# Patient Record
Sex: Male | Born: 1940 | Race: White | Hispanic: No
Health system: Southern US, Community
[De-identification: ages and names within clinical notes are randomized; demographics above are authoritative.]

---

## 2006-11-18 ENCOUNTER — Emergency Department: Payer: Self-pay | Admitting: Emergency Medicine

## 2006-11-18 ENCOUNTER — Other Ambulatory Visit: Payer: Self-pay

## 2007-12-23 ENCOUNTER — Ambulatory Visit: Payer: Self-pay | Admitting: Family Medicine

## 2009-04-02 ENCOUNTER — Emergency Department: Payer: Self-pay | Admitting: Internal Medicine

## 2009-12-31 ENCOUNTER — Emergency Department: Payer: Self-pay | Admitting: Internal Medicine

## 2013-04-28 ENCOUNTER — Ambulatory Visit: Payer: Self-pay | Admitting: Gastroenterology

## 2013-04-29 LAB — PATHOLOGY REPORT

## 2013-12-05 LAB — URINALYSIS, COMPLETE
Bacteria: NONE SEEN
Bilirubin,UR: NEGATIVE
GLUCOSE, UR: NEGATIVE mg/dL (ref 0–75)
Leukocyte Esterase: NEGATIVE
NITRITE: NEGATIVE
Ph: 6 (ref 4.5–8.0)
Protein: 30
RBC,UR: 362 /HPF (ref 0–5)
Specific Gravity: 1.02 (ref 1.003–1.030)
Squamous Epithelial: 1
WBC UR: 1 /HPF (ref 0–5)

## 2013-12-05 LAB — CBC
HCT: 39 % — ABNORMAL LOW (ref 40.0–52.0)
HGB: 13 g/dL (ref 13.0–18.0)
MCH: 33.1 pg (ref 26.0–34.0)
MCHC: 33.2 g/dL (ref 32.0–36.0)
MCV: 100 fL (ref 80–100)
PLATELETS: 198 10*3/uL (ref 150–440)
RBC: 3.92 10*6/uL — AB (ref 4.40–5.90)
RDW: 14.3 % (ref 11.5–14.5)
WBC: 9.1 10*3/uL (ref 3.8–10.6)

## 2013-12-05 LAB — COMPREHENSIVE METABOLIC PANEL
ALT: 23 U/L (ref 12–78)
ANION GAP: 4 — AB (ref 7–16)
Albumin: 3.6 g/dL (ref 3.4–5.0)
Alkaline Phosphatase: 142 U/L — ABNORMAL HIGH
BILIRUBIN TOTAL: 0.5 mg/dL (ref 0.2–1.0)
BUN: 16 mg/dL (ref 7–18)
CHLORIDE: 101 mmol/L (ref 98–107)
Calcium, Total: 8.3 mg/dL — ABNORMAL LOW (ref 8.5–10.1)
Co2: 30 mmol/L (ref 21–32)
Creatinine: 0.76 mg/dL (ref 0.60–1.30)
EGFR (African American): >60
EGFR (Non-African Amer.): >60
Glucose: 97 mg/dL (ref 65–99)
OSMOLALITY: 271 (ref 275–301)
Potassium: 3.8 mmol/L (ref 3.5–5.1)
SGOT(AST): 39 U/L — ABNORMAL HIGH (ref 15–37)
Sodium: 135 mmol/L — ABNORMAL LOW (ref 136–145)
Total Protein: 7.1 g/dL (ref 6.4–8.2)

## 2013-12-05 LAB — CK TOTAL AND CKMB (NOT AT ARMC)
CK, TOTAL: 686 U/L — AB
CK-MB: 11.3 ng/mL — ABNORMAL HIGH (ref 0.5–3.6)

## 2013-12-05 LAB — PHENYTOIN LEVEL, TOTAL: DILANTIN: 39.9 ug/mL — AB (ref 10.0–20.0)

## 2013-12-05 LAB — ETHANOL: Ethanol: <3 mg/dL

## 2013-12-05 LAB — TROPONIN I: Troponin-I: <0.02 ng/mL

## 2013-12-05 LAB — PROTIME-INR
INR: 1
Prothrombin Time: 12.9 secs (ref 11.5–14.7)

## 2013-12-06 LAB — PHENYTOIN LEVEL, TOTAL: DILANTIN: 39 ug/mL — AB (ref 10.0–20.0)

## 2013-12-07 ENCOUNTER — Inpatient Hospital Stay: Payer: Self-pay | Admitting: Internal Medicine

## 2013-12-07 ENCOUNTER — Ambulatory Visit: Payer: Self-pay | Admitting: Neurology

## 2013-12-07 LAB — CK: CK, TOTAL: 231 U/L

## 2013-12-07 LAB — PHENYTOIN LEVEL, TOTAL: Dilantin: 29.9 ug/mL — ABNORMAL HIGH (ref 10.0–20.0)

## 2013-12-08 LAB — PHENYTOIN LEVEL, TOTAL: DILANTIN: 21.1 ug/mL — AB (ref 10.0–20.0)

## 2013-12-10 LAB — PHENYTOIN LEVEL, TOTAL: DILANTIN: 10 ug/mL (ref 10.0–20.0)

## 2013-12-11 DIAGNOSIS — R892 Abnormal level of other drugs, medicaments and biological substances in specimens from other organs, systems and tissues: Secondary | ICD-10-CM

## 2013-12-11 DIAGNOSIS — I2510 Atherosclerotic heart disease of native coronary artery without angina pectoris: Secondary | ICD-10-CM

## 2013-12-11 DIAGNOSIS — J449 Chronic obstructive pulmonary disease, unspecified: Secondary | ICD-10-CM

## 2013-12-11 DIAGNOSIS — G40909 Epilepsy, unspecified, not intractable, without status epilepticus: Secondary | ICD-10-CM

## 2013-12-12 ENCOUNTER — Inpatient Hospital Stay: Payer: Self-pay | Admitting: Internal Medicine

## 2013-12-12 ENCOUNTER — Ambulatory Visit: Payer: Self-pay | Admitting: Neurology

## 2013-12-12 LAB — CBC WITH DIFFERENTIAL/PLATELET
Basophil #: 0.1 10*3/uL (ref 0.0–0.1)
Basophil %: 0.7 %
Eosinophil #: 0.1 10*3/uL (ref 0.0–0.7)
Eosinophil %: 0.9 %
HCT: 41.8 % (ref 40.0–52.0)
HGB: 13.4 g/dL (ref 13.0–18.0)
LYMPHS PCT: 19.4 %
Lymphocyte #: 2.7 10*3/uL (ref 1.0–3.6)
MCH: 32.4 pg (ref 26.0–34.0)
MCHC: 32.1 g/dL (ref 32.0–36.0)
MCV: 101 fL — AB (ref 80–100)
Monocyte #: 1.4 x10 3/mm — ABNORMAL HIGH (ref 0.2–1.0)
Monocyte %: 10.4 %
NEUTROS PCT: 68.6 %
Neutrophil #: 9.5 10*3/uL — ABNORMAL HIGH (ref 1.4–6.5)
PLATELETS: 290 10*3/uL (ref 150–440)
RBC: 4.15 10*6/uL — ABNORMAL LOW (ref 4.40–5.90)
RDW: 14.7 % — ABNORMAL HIGH (ref 11.5–14.5)
WBC: 13.8 10*3/uL — AB (ref 3.8–10.6)

## 2013-12-12 LAB — COMPREHENSIVE METABOLIC PANEL
ALT: 54 U/L (ref 12–78)
Albumin: 3.1 g/dL — ABNORMAL LOW (ref 3.4–5.0)
Alkaline Phosphatase: 139 U/L — ABNORMAL HIGH
Anion Gap: 7 (ref 7–16)
BUN: 29 mg/dL — ABNORMAL HIGH (ref 7–18)
Bilirubin,Total: 0.3 mg/dL (ref 0.2–1.0)
CALCIUM: 8.4 mg/dL — AB (ref 8.5–10.1)
Chloride: 102 mmol/L (ref 98–107)
Co2: 29 mmol/L (ref 21–32)
Creatinine: 1 mg/dL (ref 0.60–1.30)
EGFR (African American): >60
Glucose: 254 mg/dL — ABNORMAL HIGH (ref 65–99)
Osmolality: 290 (ref 275–301)
Potassium: 4.5 mmol/L (ref 3.5–5.1)
SGOT(AST): 43 U/L — ABNORMAL HIGH (ref 15–37)
Sodium: 138 mmol/L (ref 136–145)
TOTAL PROTEIN: 6.9 g/dL (ref 6.4–8.2)

## 2013-12-12 LAB — CK TOTAL AND CKMB (NOT AT ARMC)
CK, Total: 62 U/L
CK-MB: 3.4 ng/mL (ref 0.5–3.6)

## 2013-12-12 LAB — TROPONIN I: Troponin-I: 0.02 ng/mL

## 2013-12-12 LAB — PRO B NATRIURETIC PEPTIDE: B-Type Natriuretic Peptide: 420 pg/mL — ABNORMAL HIGH (ref 0–125)

## 2013-12-12 LAB — PHENYTOIN LEVEL, TOTAL: DILANTIN: 3 ug/mL — AB (ref 10.0–20.0)

## 2013-12-13 LAB — CBC WITH DIFFERENTIAL/PLATELET
BASOS ABS: 0.1 10*3/uL (ref 0.0–0.1)
BASOS PCT: 0.9 %
Eosinophil #: 0.1 10*3/uL (ref 0.0–0.7)
Eosinophil %: 1.1 %
HCT: 38.7 % — AB (ref 40.0–52.0)
HGB: 13.1 g/dL (ref 13.0–18.0)
LYMPHS PCT: 11.3 %
Lymphocyte #: 1.2 10*3/uL (ref 1.0–3.6)
MCH: 33.3 pg (ref 26.0–34.0)
MCHC: 33.9 g/dL (ref 32.0–36.0)
MCV: 98 fL (ref 80–100)
Monocyte #: 1.4 x10 3/mm — ABNORMAL HIGH (ref 0.2–1.0)
Monocyte %: 13.3 %
NEUTROS PCT: 73.4 %
Neutrophil #: 7.7 10*3/uL — ABNORMAL HIGH (ref 1.4–6.5)
PLATELETS: 233 10*3/uL (ref 150–440)
RBC: 3.94 10*6/uL — AB (ref 4.40–5.90)
RDW: 15 % — ABNORMAL HIGH (ref 11.5–14.5)
WBC: 10.4 10*3/uL (ref 3.8–10.6)

## 2013-12-13 LAB — BASIC METABOLIC PANEL
Anion Gap: 2 — ABNORMAL LOW (ref 7–16)
BUN: 13 mg/dL (ref 7–18)
CHLORIDE: 105 mmol/L (ref 98–107)
Calcium, Total: 7.6 mg/dL — ABNORMAL LOW (ref 8.5–10.1)
Co2: 31 mmol/L (ref 21–32)
Creatinine: 0.56 mg/dL — ABNORMAL LOW (ref 0.60–1.30)
EGFR (African American): >60
EGFR (Non-African Amer.): >60
Glucose: 115 mg/dL — ABNORMAL HIGH (ref 65–99)
Osmolality: 277 (ref 275–301)
POTASSIUM: 3.8 mmol/L (ref 3.5–5.1)
SODIUM: 138 mmol/L (ref 136–145)

## 2013-12-14 LAB — CBC WITH DIFFERENTIAL/PLATELET
BASOS ABS: 0.1 10*3/uL (ref 0.0–0.1)
Basophil %: 0.8 %
Eosinophil #: 0.1 10*3/uL (ref 0.0–0.7)
Eosinophil %: 1 %
HCT: 31.2 % — ABNORMAL LOW (ref 40.0–52.0)
HGB: 10.6 g/dL — ABNORMAL LOW (ref 13.0–18.0)
LYMPHS ABS: 0.6 10*3/uL — AB (ref 1.0–3.6)
Lymphocyte %: 8 %
MCH: 33.4 pg (ref 26.0–34.0)
MCHC: 33.9 g/dL (ref 32.0–36.0)
MCV: 98 fL (ref 80–100)
MONOS PCT: 10.9 %
Monocyte #: 0.8 x10 3/mm (ref 0.2–1.0)
Neutrophil #: 5.9 10*3/uL (ref 1.4–6.5)
Neutrophil %: 79.3 %
PLATELETS: 143 10*3/uL — AB (ref 150–440)
RBC: 3.17 10*6/uL — AB (ref 4.40–5.90)
RDW: 14.8 % — ABNORMAL HIGH (ref 11.5–14.5)
WBC: 7.4 10*3/uL (ref 3.8–10.6)

## 2013-12-14 LAB — BASIC METABOLIC PANEL
ANION GAP: 4 — AB (ref 7–16)
BUN: 9 mg/dL (ref 7–18)
CREATININE: 0.64 mg/dL (ref 0.60–1.30)
Calcium, Total: 7.6 mg/dL — ABNORMAL LOW (ref 8.5–10.1)
Chloride: 103 mmol/L (ref 98–107)
Co2: 31 mmol/L (ref 21–32)
EGFR (African American): >60
Glucose: 90 mg/dL (ref 65–99)
Osmolality: 274 (ref 275–301)
Potassium: 3.5 mmol/L (ref 3.5–5.1)
Sodium: 138 mmol/L (ref 136–145)

## 2013-12-16 DIAGNOSIS — J9600 Acute respiratory failure, unspecified whether with hypoxia or hypercapnia: Secondary | ICD-10-CM

## 2013-12-16 DIAGNOSIS — E440 Moderate protein-calorie malnutrition: Secondary | ICD-10-CM

## 2013-12-16 DIAGNOSIS — G40909 Epilepsy, unspecified, not intractable, without status epilepticus: Secondary | ICD-10-CM

## 2013-12-16 LAB — EXPECTORATED SPUTUM ASSESSMENT W GRAM STAIN, RFLX TO RESP C

## 2013-12-17 LAB — CULTURE, BLOOD (SINGLE)

## 2014-01-20 DIAGNOSIS — J449 Chronic obstructive pulmonary disease, unspecified: Secondary | ICD-10-CM

## 2014-01-20 DIAGNOSIS — G40909 Epilepsy, unspecified, not intractable, without status epilepticus: Secondary | ICD-10-CM

## 2014-01-20 DIAGNOSIS — E441 Mild protein-calorie malnutrition: Secondary | ICD-10-CM

## 2014-02-25 ENCOUNTER — Emergency Department: Payer: Self-pay | Admitting: Emergency Medicine

## 2014-03-11 DEATH — deceased

## 2014-04-14 ENCOUNTER — Ambulatory Visit: Payer: Self-pay | Admitting: Internal Medicine

## 2014-04-21 ENCOUNTER — Ambulatory Visit: Payer: Self-pay | Admitting: Internal Medicine

## 2015-01-02 NOTE — Discharge Summary (Signed)
PATIENT NAME:  Henry Osborn, Henry Osborn MR#:  [id number] DATE OF BIRTH:  Mar 16, 1941  DATE OF ADMISSION:  12/12/2013 DATE OF DISCHARGE: 12/16/2013   PRIMARY CARE PHYSICIAN: Johnna Acosta, MD  FINAL DIAGNOSES:  1.  Acute respiratory failure, which resolved.  2.  Possible septic shock.  3.  Seizure.  4.  Malnutrition.  5.  Chronic obstructive pulmonary disease.  6.  Gastroesophageal reflux disease.   MEDICATIONS ON DISCHARGE: Include omeprazole 40 mg daily, acetaminophen 325 mg 2 tablets every 4 hours as needed for pain or temperature, Ensure Plus 237 mL 3 times a day with meals, Combivent Respimat CFC 1 puff 4 times a day as needed for shortness of breath and wheezing, Advair Diskus 250/50, 1 puff twice a day, Spiriva 18 mcg per inhalation, 1 inhalation daily, aspirin 325 mg daily, Vimpat other name is lacosamide 200 mg twice a day, Levaquin 500 mg 1 tablet daily for 10 more days.   DRESSING CARE: Right second finger keep covered until healed.   DIET: Regular diet, Ensure Plus 3 times a day, regular consistency.   ACTIVITY: As tolerated.   FOLLOWUP: With physical therapy. Follow up in 1 to 2 days with doctor at rehab.   HOSPITAL COURSE: The patient was admitted December 12, 2013 and discharged December 16, 2013. He was admitted to the hospital with altered mental status and shortness of breath and was found to have acute respiratory failure, possible aspiration. The patient was intubated. A central line was placed. Cultures were obtained. The patient was on broad-spectrum antibiotics initially for potential aspiration pneumonia. Neurology consultation was obtained by Dr. Loretha Brasil, who recommended IV Vimpat 200 mg twice a day. The patient was seen in consultation by Dr. Freda Munro of pulmonary.   LABORATORY AND RADIOLOGICAL DATA DURING THE HOSPITAL COURSE: Included troponin that was negative. Dilantin level 3. Glucose 254, BUN 29, creatinine 1.0, sodium 138, potassium 4.5, chloride 102, CO2 29, calcium 8.4.  Liver function tests, alkaline phosphatase 139, ALT 54, AST 43. [place] blood cell count 13.8, hemoglobin and hematocrit 13.4 and 41.8 and platelet count of 290. BNP 420. EKG showed sinus tachycardia, biatrial enlargement, left axis deviation, pulmonary disease pattern. Chest x-ray showed findings of cardiomegaly and COPD. Initial NG tube was in the bronchus. ABG showed a pH of 7.28, pCO2 53, pO2 221; that is on 60% oxygen on the ventilator. A CT scan of the head: No acute intracranial process, status post right frontal temporal craniotomy for clipping in the past. Repeat chest x-ray after central line. No pneumothorax. An NG tube was replaced in the stomach. Blood cultures negative. Sputum culture grew out rare pseudomonas. Perlow count upon discharge 7.4. Chemistry within normal range.   HOSPITAL COURSE PER PROBLEM LIST:  1.  For the patient's acute respiratory failure, the patient was extubated on the afternoon of April 04. He has been on room air and breathing comfortably at this point. The patient was started on aggressive antibiotics and switched over to just Levaquin and will finish up a course of Levaquin.  2.  For the patient's possible septic shock, the patient had acute encephalopathy also. The patient was given broad antibiotics and sputum culture grew out rare pseudomonas. I will treat for a total of 2 weeks with Levaquin 500 mg p.o. b.i.d.  3.  For the patient's seizure disorder, he was put on IV Vimpat. I will switch over to oral 200 mg twice a day. EEG was done that showed no seizure activity, normal awake EEG.  4.  For malnutrition, the patient is on Ensure Plus.  5.  For the patient's COPD, respiratory status is stable on inhalers.  6.  Gastroesophageal reflux disease, on omeprazole.   The patient is a DO NOT RESUSCITATE and will be transferred over to rehab today in stable condition on December 16, 2013.   TIME SPENT ON DISCHARGE: 40 minutes.   ____________________________ Herschell Dimesichard J.  Renae GlossWieting, MD rjw:aw D: 12/16/2013 11:17:02 ET T: 12/16/2013 11:29:55 ET JOB#: [id number]  cc: Herschell Dimesichard J. Renae GlossWieting, MD, <Dictator> Abby D. Hessie DienerBender MD [facility] Salley ScarletICHARD J Jada Fass MD ELECTRONICALLY SIGNED 12/17/2013 16:22

## 2015-01-02 NOTE — Consult Note (Signed)
Chief Complaint:  Subjective/Chief Complaint this morning looked good without any distress. Patient was extubated and has done well   VITAL SIGNS/ANCILLARY NOTES: **Vital Signs.:   05-Apr-15 11:00  Vital Signs Type Routine  Pulse Pulse 88  Respirations Respirations 19  Systolic BP Systolic BP 502  Diastolic BP (mmHg) Diastolic BP (mmHg) 63  Mean BP 76  Pulse Ox % Pulse Ox % 94  Pulse Ox Activity Level  At rest  Oxygen Delivery Room Air/ 21 %  Pulse Ox Heart Rate 86  *Intake and Output.:   05-Apr-15 12:00  Grand Totals Intake:  160 Output:      Net:  160 25 Hr.:  220  IV (Primary)      In:  60  IV (Primary)      In:  100  Urinary Method  Foley   Brief Assessment:  GEN no acute distress   Cardiac Regular  -- carotid bruits  -- LE edema  -- JVD   Respiratory normal resp effort  clear BS  no use of accessory muscles   Gastrointestinal details normal Soft  Nontender  No gaurding   EXTR negative cyanosis/clubbing   Lab Results: Routine Chem:  05-Apr-15 04:30   Result Comment LABS - This specimen was collected through an   - indwelling catheter or arterial line.  - A minimum of 70ms of blood was wasted prior    - to collecting the sample.  Interpret  - results with caution.  Result(s) reported on 14 Dec 2013 at 04:49AM.  Glucose, Serum 90  BUN 9  Creatinine (comp) 0.64  Sodium, Serum 138  Potassium, Serum 3.5  Chloride, Serum 103  CO2, Serum 31  Calcium (Total), Serum  7.6  Anion Gap  4  Osmolality (calc) 274  eGFR (African American) >60  eGFR (Non-African American) >60 (eGFR values <625mmin/1.73 m2 may be an indication of chronic kidney disease (CKD). Calculated eGFR is useful in patients with stable renal function. The eGFR calculation will not be reliable in acutely ill patients when serum creatinine is changing rapidly. It is not useful in  patients on dialysis. The eGFR calculation may not be applicable to patients at the low and high extremes of body  sizes, pregnant women, and vegetarians.)  Routine Hem:  05-Apr-15 04:30   WBC (CBC) 7.4  RBC (CBC)  3.17  Hemoglobin (CBC)  10.6  Hematocrit (CBC)  31.2  Platelet Count (CBC)  143  MCV 98  MCH 33.4  MCHC 33.9  RDW  14.8  Neutrophil % 79.3  Lymphocyte % 8.0  Monocyte % 10.9  Eosinophil % 1.0  Basophil % 0.8  Neutrophil # 5.9  Lymphocyte #  0.6  Monocyte # 0.8  Eosinophil # 0.1  Basophil # 0.1   Radiology Results: XRay:    04-Apr-15 05:22, Chest Portable Single View  Chest Portable Single View   REASON FOR EXAM:    vent  COMMENTS:       PROCEDURE: DXR - DXR PORTABLE CHEST SINGLE VIEW  - Dec 13 2013  5:22AM     CLINICAL DATA:  Ventilator dependent    EXAM:  PORTABLE CHEST - 1 VIEW    COMPARISON:  12/12/2013    FINDINGS:  Cardiac shadow is stable. A right-sided central venous line,  endotracheal tube and nasogastric catheter are again noted and  stable in appearance. The lungs are hyperinflated without focal  infiltrate. No acute bony abnormality is seen.     IMPRESSION:  No acute  abnormality noted.    Tubes and lines as described.      Electronically Signed    By: Inez Catalina M.D.    On: 12/13/2013 07:14         Verified By: Everlene Farrier, M.D.,   Assessment/Plan:  Assessment/Plan:  Assessment 1. acute respiratory failure -doing well oxyen weaned to RA -should be able to move out to the floor   Electronic Signatures: Allyne Gee (MD)  (Signed 05-Apr-15 15:19)  Authored: Chief Complaint, VITAL SIGNS/ANCILLARY NOTES, Brief Assessment, Lab Results, Radiology Results, Assessment/Plan   Last Updated: 05-Apr-15 15:19 by Allyne Gee (MD)
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
## 2015-01-02 NOTE — Discharge Summary (Signed)
PATIENT NAME:  Henry Osborn, Henry Osborn MR#:  [id number] DATE OF BIRTH:  12-Jul-1941  DATE OF ADMISSION:  12/07/2013 DATE OF DISCHARGE:  12/10/2013  ADMITTING DIAGNOSIS: Fall, gait instability.   DISCHARGE DIAGNOSES: 1.  Fall, gait instability due to severe Dilantin toxicity, as well as adverse reaction to Neurontin, status post evaluation by neurology. He is now on treatment with Vimpat for seizures.  2.  History of cerebral aneurysm, status post clipping at age 74 with residual seizure disorder.  3.  History of coronary artery disease.  4. Chronic obstructive pulmonary disease with possible acute exacerbation during hospitalization, now improved.  5.  Rhabdomyolysis, which was in mild due to fall, now improved.   PERTINENT LABS AND EVALUATIONS: Admitting glucose 97, BUN 16, creatinine 0.76, sodium 135, potassium 3.8, chloride 101. CO2 was 30. Calcium was 8.3. LFTs were normal, except alk phos of 143. AST was 39. CPK was 689. CK-MB was 11.3. Troponin was less than 0.02. Dilantin level was 39; most recent Dilantin level today is 10.  WBC 9.1, hemoglobin 13, platelet count 198. CT scan of the head showed negative for acute intracranial abnormality. There was no evidence of cervical spine injury. Remote right-sided aneurysm clipping.   CONSULTANTS: Dr. Rogue Jury.   HOSPITAL COURSE: Please refer to H and P done by the admitting physician. The patient is a 74 year old [place] male with previous history of cerebral aneurysm clipping and a history of seizure, who is chronically on Dilantin and Neurontin,  who lives at home alone. Was taking his Dilantin accidentally too much. Was brought to the hospital with complaint of falls, unsteady gait, worsening over the past 1 to 2 weeks. The patient was admitted to the hospital and his Dilantin was held. He was given IV fluids. He was slow to improve. He was seen in consultation by neurology. They recommended changing his treatment to Vimpat and slowly titrating up the  dose. The patient was also on Neurontin, which has been discontinued due to a concern of his toxicity. The patient also was having some respiratory difficulty and was thought to have a COPD, which was treated with nebulizers and steroids, and now his symptoms are significantly improved. The patient also has some tremulous movement of his extremities and his head, which is felt to be chronic and is currently stable. He is doing much better. He is asymptomatic, but requires significant rehab and therapy. He will need to be discharged to rehab for further evaluation and treatment.   DISCHARGE MEDICATIONS: Omeprazole 40 daily, Tylenol 650 q.4 p.r.n., lacosamide 100 mg 1 tab p.o. q.12 to be treated until 04/02, and then the dose needs to be increased at 200 p.o. b.i.d. starting 04/03; Ensure Plus 1 tabs p.o. t.i.d., Combivent 1 puff 4 times a day, Advair 250/50, 1 puff b.i.d.; Spiriva inhalation daily, aspirin 325 daily.   OXYGEN:  None.   DIET: Low sodium, regular consistency.   ACTIVITY: As tolerated with PT evaluation and treatment. Follow up with M.D. at rehab in 1 to 2 weeks.   Time spent on this patient's discharge, 35 minutes.     ____________________________ Lafonda Mosses Posey Pronto, MD shp:dmm D: 12/10/2013 13:06:00 ET T: 12/10/2013 13:21:50 ET JOB#: [id number]  cc: Haroon Shatto H. Posey Pronto, MD, <Dictator> Alric Seton MD ELECTRONICALLY SIGNED 12/19/2013 8:48

## 2015-01-02 NOTE — H&P (Signed)
PATIENT NAME:  Henry Osborn, Henry Osborn MR#:  [id number] DATE OF BIRTH:  December 12, 1940  DATE OF ADMISSION:  12/12/2013  PRIMARY CARE PHYSICIAN: Abby D. Bender MD  REFERRING PHYSICIAN: Little Sturgeon Sink. Dolores Frame, MD  CHIEF COMPLAINT: Altered mental status and shortness of breath.   HISTORY OF PRESENT ILLNESS: The patient is a 74 year old male with past medical history of COPD, brain aneurysm status post clipping at age 57 with residual seizure disorder, who was just recently admitted to the hospital for mechanical fall on 12/05/2013. His frequent falls were assumed to be from Dilantin toxicity, and Dilantin was discontinued, and the patient was started on Vimpat for seizures, and he was just discharged to a nursing home on 12/10/2013. While the nursing staff were doing rounds, the patient was found to be in acute respiratory distress and hypoxic. EMS was called, and the patient is brought into the ER. The patient was disoriented and combative. Initial blood gas has revealed a pH of 7.28, pCO2 53. The patient was intubated for acute respiratory failure. CT head was done as the patient has history of brain aneurysm, which has revealed no acute abnormalities. The patient's combativeness was assumed to be from postictal state. We assumed that probably the patient had seizure in the nursing home, and in the postictal phase, probably aspirated. The patient was given IV antibiotics after blood cultures, and hospitalist team is called to admit the patient. No family members are at bedside during my examination. The patient had left subclavian central line placement done in the ER. The patient was started on propofol initially, but being hypotensive, we are changing that to Versed and fentanyl and starting him on Levophed to support his blood pressure.   PAST MEDICAL HISTORY:  1. Chronic history of COPD. 2. History of brain aneurysm, status post clipping.  3. Recent history of Dilantin toxicity.  4. 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  5. Coronary artery disease.   PAST SURGICAL HISTORY: Brain clipping.  ALLERGIES: HE IS ALLERGIC TO PENICILLIN.   PSYCHOSOCIAL HISTORY: Just discharged to a nursing home for rehabilitation on April 1st. No history of alcohol or drug use, according to the old records. Remote tobacco use.  FAMILY HISTORY: CVA and diabetes run in his family.   HOME MEDICATIONS:  1. Spiriva 18 mcg inhalation once a day. 2. Omeprazole 40 mg once daily.  3. Lacosamide 200 mg 1 tablet p.o. q.12 hours.  4. Lacosamide 100 mg q.12 hours.  5. Ensure Plus 237 mL p.o. 3 times a day. 6. Combivent 1 puff inhalation 4 times a day.  7. Aspirin 325 mg once daily. 8. Advair 250/50 one puff inhalation 2 times a day.  9. Acetaminophen 325 mg 2 tablets p.o. q.4 hours as needed.   REVIEW OF SYSTEMS: Unobtainable as the patient is intubated and sedated.   PHYSICAL EXAMINATION:  VITAL SIGNS: Temperature 100.2, pulse 90, respirations 18, blood pressure 89/67, pulse oximetry 96% on ventilator.  HEENT: Normocephalic, atraumatic. Pupils are equally but sluggishly reacting to light and accommodation. Nose not congested. ET tube is intact.  NECK: Supple, with right-sided central line. No JVD.  LUNGS: Coarse breath sounds with rales and rhonchi.  CARDIOVASCULAR: S1, S2, regular rate and rhythm. No murmurs.  GASTROINTESTINAL: Soft. Bowel sounds are positive in all 4 quadrants. Nontender, nondistended. No hepatosplenomegaly. No masses felt.  NEUROLOGIC: The patient is sedated. Reflexes are 2+.  EXTREMITIES: No edema. No cyanosis. No clubbing. Multiple excoriations and bruises were noted in the lower extremities.  PSYCHIATRIC: Could  not be elicited as the patient is sedated.  LABORATORY AND IMAGING STUDIES: A pH 7.28, pCO2 53, pO2 221 on 60% of FiO2 in AC mode. The patient's rate is set at 14, tidal volume 500 and PEEP of 5. Dilantin level is 3.0. WBC 13.8, hemoglobin 13.4, hematocrit is 41.8, platelets are 290, MCV 101.  First set of cardiac enzymes is negative. LFTs: Total protein 6.9, albumin 3.1, alkaline phosphatase is elevated at 139, AST 43. The rest of the LFTs are normal. CHEM-8: Glucose 254, BNP 420, BUN 29, creatinine 1.00, sodium 139, potassium 4.5, chloride 102, CO2 29, GFR greater than 60, anion gap is 7, serum osmolality 290, calcium 8.4. Portable chest x-ray, single view, has revealed nasogastric tip projects in the left lower lobe bronchus. Endotracheal tube tip was at 4.3 cm above the carina. Mild cardiomegaly and COPD. This is after intubation. Prior to the intubation, right IJ central venous catheter tip projection to the distal tip of the vena cava. No pneumothorax. Endotracheal tube tip was at 4.9 cm above the carina. Mild cardiomegaly and COPD. A 12-lead EKG: Sinus tachycardia at 116 beats per minute, biatrial enlargement, left axis deviation, nonspecific ST-T wave changes.   ASSESSMENT AND PLAN: A 74 year old male who was just discharged from the hospital on April 1st to nursing home for rehabilitation with a diagnosis of Dilantin toxicity, who is presenting to the ER with a chief complaint of shortness of breath and altered mental status.   1. Acute respiratory failure, probably from aspiration pneumonia. The patient is intubated. Central line is placed. Cultures were obtained. The patient will be on broad-spectrum antibiotics for aspiration pneumonia.  Pulmonology consult is placed.  The patient will be on Versed and fentanyl for sedation.  2. Sepsis from aspiration pneumonia with fever, leukocytosis and hypotension. Continue IV antibiotics.  3. Probably breakthrough seizures and aspirated during postictal state. The patient is on Versed and fentanyl. Neurology consult is placed.  4. Chronic history of chronic obstructive pulmonary disease, no exacerbation at this time. Will provide him nebulizer treatments p.r.n.  5. History of brain aneurysm status post clipping. CT head is normal with no acute  findings or bleeding.  6. Will provide him gastrointestinal and deep vein thrombosis prophylaxis.  7. No family members are available at bedside. Will try to reach family members.   CRITICAL CARE TIME SPENT: 60 minutes.  ____________________________ Ramonita LabAruna Mayer Vondrak, MD ag:lb D: 12/12/2013 07:19:57 ET T: 12/12/2013 07:44:35 ET JOB#: [id number]  cc: Ramonita LabAruna Manan Olmo, MD, <Dictator> Abby D. Hessie DienerBender MD Ramonita LabARUNA Garris Melhorn MD ELECTRONICALLY SIGNED 12/18/2013 4:49

## 2015-01-02 NOTE — Consult Note (Signed)
PATIENT NAME:  Henry Osborn, Henry Osborn MR#:  [id number] DATE OF BIRTH:  02-Dec-1940  DATE OF CONSULTATION:  12/12/2013  CONSULTING PHYSICIAN:  Pauletta BrownsYuriy Eron Goble, MD  REASON FOR CONSULTATION: Seizure.  HISTORY OF PRESENTING ILLNESS: This is a 74 year old gentleman with past medical history of COPD, history of brain aneurysm status post clipping at the age of 74 and residual seizure disorder, which is currently being treated on Vimpat 200 mg once a day and 100 mg at night. The patient has had frequent falls, suspected to be Dilantin toxicity, at which point Dilantin was discontinued and Vimpat was started. The patient was discharged to a nursing home facility only 2 days ago. While the patient was being seen on rounds at the nursing home facility, he was found to be in acute respiratory distress, hypoxic. Initial ABG was acidotic with respiratory acidosis and elevated pCO2. There was a questionable aspiration event. The patient is status post CAT scan of the head that did not show any acute intracranial abnormalities. Neurology consulted for possibility of seizure activity and postictal state. The patient admitted to critical care unit, intubated, sedated on propofol, and Levophed was started. The patient is also on multiple antibiotics for suspicion of aspiration pneumonia.   PAST MEDICAL HISTORY: COPD, history brain aneurysm, recent history of Dilantin toxicity status post discontinuation,  residual seizure post aneurysm, coronary artery disease.  HOME MEDICATION: Includes Spiriva, omeprazole, lacosamide 200 mg a.m., lacosamide 100 mg p.m., Ensure Plus, Combivent, aspirin 325, Advair, acetaminophen.  REVIEW OF SYSTEMS: Unable to assess.   SOCIAL HISTORY: Unable to assess.  FAMILY HISTORY: Unable to assess.   PHYSICAL EXAMINATION: VITAL SIGNS: Included a pulse of 62, respirations 13, blood pressure is hypotensive at 89/55.  NEUROLOGIC: Unable to obtain full evaluation at this point because of the patient's  current condition and sedation. The patient does not follow any commands, is sedated, appears to have disconjugate gaze. Pupils are sluggish to respond. He does have present corneals and cough. There is no response to any painful stimuli.   IMPRESSION: A 74 year old male, recently discharged from the hospital 2 days ago because of falls, on Vimpat for seizure disorder of 300 mg a day, who presented with respiratory distress, questionable postictal state with seizure, even though I am not convinced of it.   PLAN:  1. ICU care with antibiotics and pressors for suspected aspiration pneumonia. 2. For seizures, Vimpat was increased to 200 mg twice a day. There is no EEG this weekend or today. Will get EEG on Monday, but at this point, I am not convinced this was seizure activity that brought him into the hospital. Weaning off sedation when possible to try to obtain an exam.  Thank you. It was a pleasure seeing this patient.    ____________________________ Pauletta BrownsYuriy Yuvin Bussiere, MD yz:lb D: 12/12/2013 10:58:35 ET T: 12/12/2013 11:11:49 ET JOB#: [id number]  cc: Pauletta BrownsYuriy Shayne Deerman, MD, <Dictator> Pauletta BrownsYURIY Cadee Agro MD ELECTRONICALLY SIGNED 12/12/2013 11:36

## 2015-12-16 IMAGING — CR DG CHEST 1V PORT
1 series · 1 of 1 positions shown · non-contrast
Comparison: 12/12/2013

CLINICAL DATA: Ventilator dependent

EXAM:
PORTABLE CHEST - 1 VIEW

[ap]
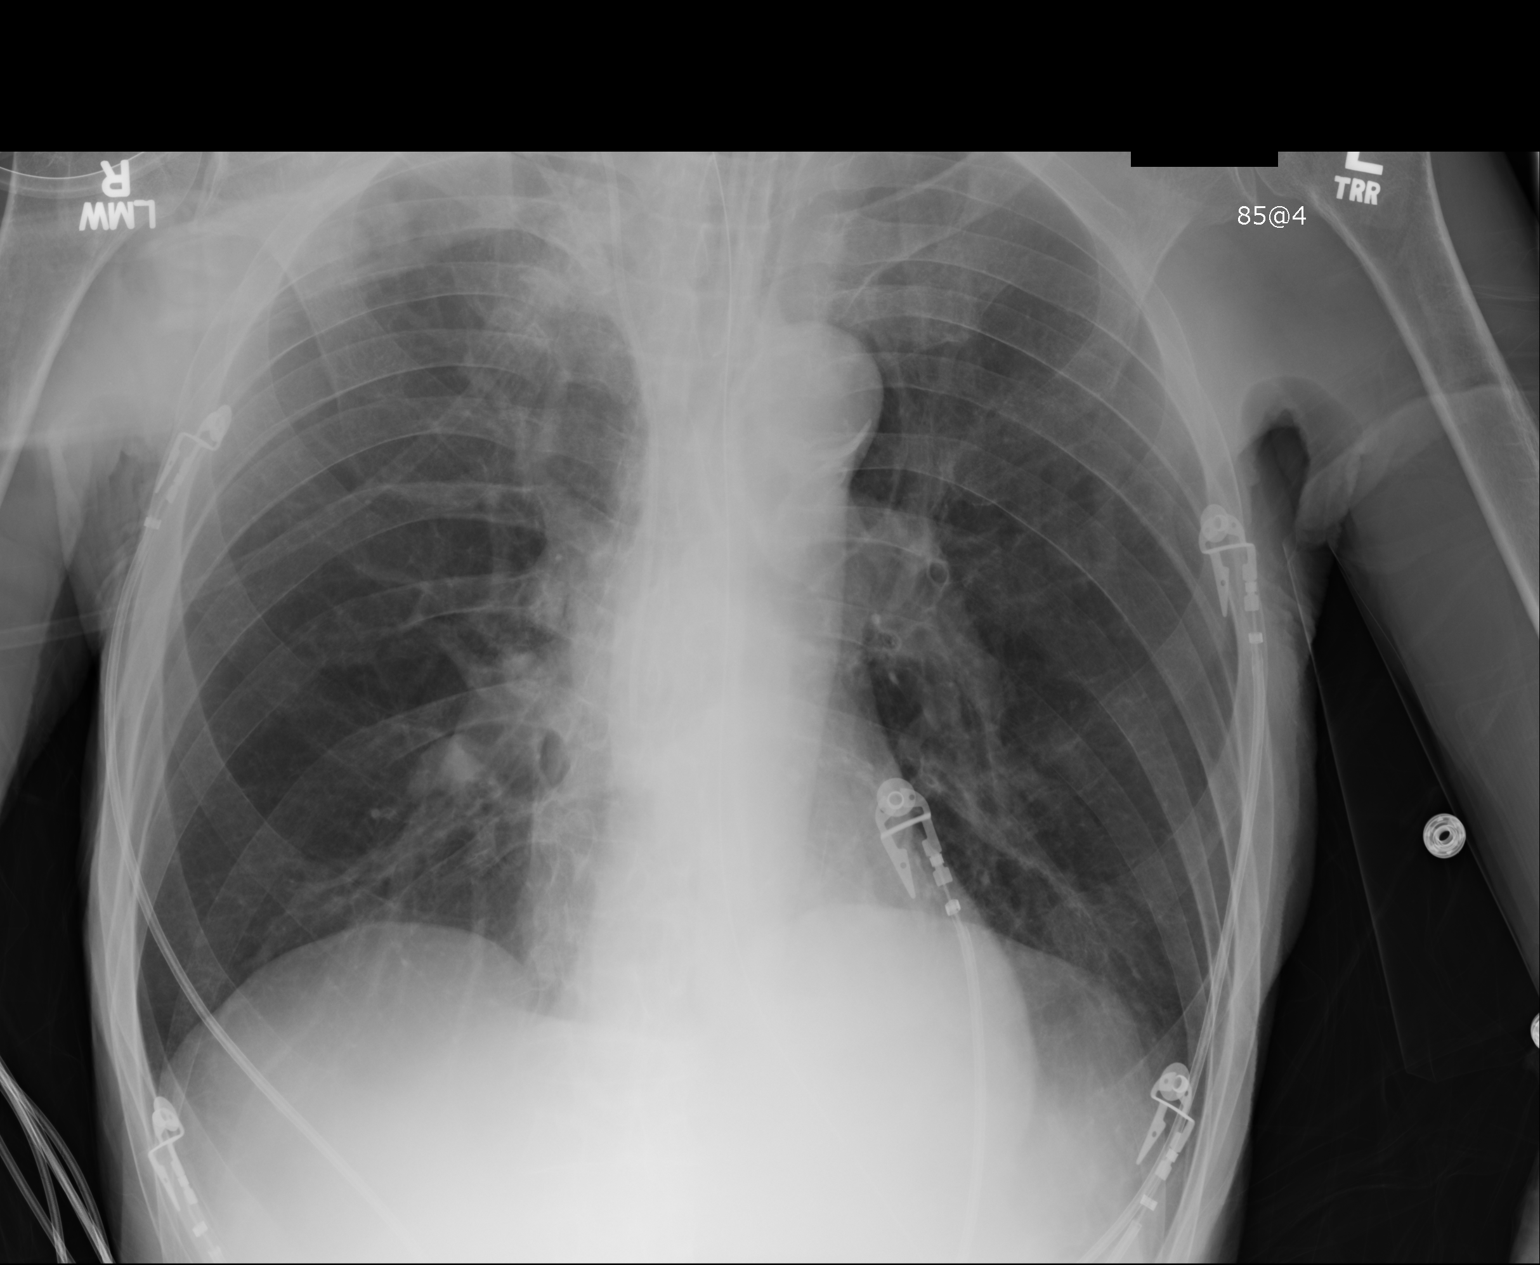

[1 of 1 positions shown; findings below may reference images not displayed]

FINDINGS: Cardiac shadow is stable. A right-sided central venous line,
endotracheal tube and nasogastric catheter are again noted and
stable in appearance. The lungs are hyperinflated without focal
infiltrate. No acute bony abnormality is seen.
IMPRESSION: No acute abnormality noted.

Tubes and lines as described.
